# Patient Record
Sex: Female | Born: 2000 | Race: White | Hispanic: No | Marital: Single | Smoking: Never smoker
Health system: Southern US, Community
[De-identification: ages and names within clinical notes are randomized; demographics above are authoritative.]

## PROBLEM LIST (undated history)

## (undated) DIAGNOSIS — L0591 Pilonidal cyst without abscess: Secondary | ICD-10-CM

---
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
## 2019-01-01 ENCOUNTER — Encounter (HOSPITAL_COMMUNITY)
Admission: RE | Admit: 2019-01-01 | Discharge: 2019-01-01 | Disposition: A | Discharge status: Home / Self Care | Payer: BLUE CROSS/BLUE SHIELD | Source: Ambulatory Visit | Attending: Hematology and Oncology | Admitting: Hematology and Oncology

## 2019-01-01 DIAGNOSIS — R911 Solitary pulmonary nodule: Secondary | ICD-10-CM | POA: Insufficient documentation

## 2019-01-01 LAB — GLUCOSE, CAPILLARY: GLUCOSE-CAPILLARY: 83 mg/dL (ref 70–99)

## 2019-01-01 MED ORDER — FLUDEOXYGLUCOSE F - 18 (FDG) INJECTION
7.4200 | Freq: Once | INTRAVENOUS | Status: AC | PRN
  Administered 2019-01-01: 7.42 via INTRAVENOUS

## 2019-01-02 NOTE — Progress Notes (Signed)
Patient Care Team: Porfirio Oar, PA as PCP - General (Family Medicine)  DIAGNOSIS:    ICD-10-CM   1. Solitary pulmonary nodule R91.1 CT Chest W Contrast    Lupus anticoagulant panel    CMP (Cancer Center only)    CBC with Differential (Cancer Center Only)  2. 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 A PET scan from 01/02/19 showed regression of the left lung lesion with low-level hypermetabolism favoring infection versus less likely infarct. She presents to the clinic today with her mom and grandparents. She denies chest pain, SOB, fever, or cough. She reviewed her medication list with me.   REVIEW OF SYSTEMS:   Constitutional: Denies fevers, chills or abnormal weight loss Eyes: Denies blurriness of vision Ears, nose, mouth, throat, and face: Denies mucositis or sore throat Respiratory: Denies cough, dyspnea or wheezes Cardiovascular: Denies palpitation, chest discomfort Gastrointestinal: Denies nausea, heartburn or change in bowel habits Skin: Denies abnormal skin rashes Lymphatics: Denies new lymphadenopathy or easy bruising Neurological: Denies numbness, tingling or new weaknesses Behavioral/Psych: Mood is stable, no new changes  Extremities: No lower extremity edema All other systems were reviewed with the patient and are negative.  I have reviewed the past medical history, past surgical history, social history and family history with the patient and they are unchanged from previous note.  ALLERGIES:  has No Known Allergies.  MEDICATIONS:  Current Outpatient Medications  Medication Sig Dispense Refill  . ferrous sulfate 325 (65 FE) MG tablet Take 1 tablet (325 mg total) by mouth daily with breakfast. 60 tablet 0  . Multiple Vitamins-Iron (MULTIVITAMINS WITH IRON) TABS tablet Take  1 tablet by mouth daily. 60 tablet 0  . rivaroxaban (XARELTO) 20 MG TABS tablet Take 1 tablet (20 mg total) by mouth daily with supper. 30 tablet 6   No current facility-administered medications for this visit.     PHYSICAL EXAMINATION: ECOG PERFORMANCE STATUS: 0 - Asymptomatic  Vitals:   01/03/19 1106  BP: 120/71  Pulse: 86  Resp: 20  Temp: 98.8 F (37.1 C)  SpO2: 100%   Filed Weights   01/03/19 1106  Weight: 137 lb 8 oz (62.4 kg)    GENERAL: alert, no distress and comfortable SKIN: skin color, texture, turgor are normal, no rashes or significant lesions EYES: normal, Conjunctiva are pink and non-injected, sclera clear OROPHARYNX: no exudate, no erythema and lips, buccal mucosa, and tongue normal  NECK: supple, thyroid normal size, non-tender, without nodularity LYMPH: no palpable lymphadenopathy in the cervical, axillary or inguinal LUNGS: clear to auscultation and percussion with normal breathing effort HEART: regular rate & rhythm and no murmurs and no lower extremity edema ABDOMEN: abdomen soft, non-tender and normal bowel sounds MUSCULOSKELETAL: no cyanosis of digits and no clubbing  NEURO: alert & oriented x 3 with fluent speech, no focal motor/sensory deficits EXTREMITIES: No lower extremity edema  LABORATORY DATA:  I have reviewed the data as listed CMP Latest Ref Rng & Units 12/07/2018  Glucose 70 - 99 mg/dL 99  BUN 6 - 20 mg/dL 11  Creatinine 0.16 - 0.10 mg/dL 9.32  Sodium 355 - 732 mmol/L 138  Potassium 3.5 - 5.1 mmol/L 3.6  Chloride 98 - 111 mmol/L 105  CO2 22 - 32 mmol/L 26  Calcium 8.9 - 10.3 mg/dL 9.0  Total Protein 6.5 - 8.1 g/dL 1.8(E)  Total Bilirubin 0.3 - 1.2 mg/dL 9.9(B)  Alkaline Phos 38 - 126 U/L 129(H)  AST 15 - 41 U/L 19  ALT 0 - 44 U/L 25    Lab Results  Component Value Date   WBC 11.1 (H) 12/07/2018   HGB 9.6 (L) 12/07/2018   HCT 32.9 (L) 12/07/2018   MCV 71.2 (L) 12/07/2018   PLT 596 (H) 12/07/2018   NEUTROABS 7.0 12/07/2018     ASSESSMENT & PLAN:  Pulmonary infarct (HCC) 12/07/2018: ED visit for pain on inspiration for 2 years: 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  Inherited risk factors: 1. Factor V Leiden mutation: Normal 2. Prothrombin gene G20210A: Normal 3. Protein S deficiency: Normal 4. Protein C deficiency: Normal 5. Antithrombin deficiency: Normal  Acquired risk factors include: 1. 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 Left lower lobe lung mass: PET/CT does not show any activity and in fact it is smaller than before.  It was 5.8 cm and it is now 4 cm with SUV of 2.9.  Based on the improvement I suspect this is a benign etiology.  I suspect that this is related to pulmonary infarction. We will plan to obtain a CT and follow-up in 3 months.    Orders Placed This Encounter  Procedures  . CT Chest W Contrast    Standing Status:   Future    Standing Expiration Date:   01/03/2020    Order Specific Question:   ** REASON FOR EXAM (FREE TEXT)    Answer:   Left lung mass    Order Specific Question:   If indicated for the ordered procedure, I authorize the administration of contrast media per Radiology protocol    Answer:   Yes    Order Specific Question:   Is patient pregnant?    Answer:   No    Order Specific Question:   Preferred imaging location?    Answer:   [facility]    Order Specific Question:   Radiology Contrast Protocol - do NOT remove file path    Answer:   [email or web address]  . Lupus anticoagulant panel    Standing Status:   Future    Standing Expiration Date:   02/07/2020  . CMP (Cancer Center only)    Standing Status:    Future    Standing Expiration Date:   01/03/2020  . CBC with Differential (Cancer Center Only)    Standing Status:   Future    Standing Expiration Date:   01/03/2020   The patient has a good understanding of the overall plan. she agrees with it. she will call with any problems that may develop before the next visit here.  Serena Croissant, MD 01/03/2019  Lindsey Stewart am acting as scribe for Dr. Serena Croissant.  I have reviewed the above documentation for accuracy and completeness, and I agree with the above.

## 2019-01-03 ENCOUNTER — Other Ambulatory Visit: Payer: Self-pay

## 2019-01-03 ENCOUNTER — Inpatient Hospital Stay: Payer: BLUE CROSS/BLUE SHIELD | Attending: Hematology and Oncology | Admitting: Hematology and Oncology

## 2019-01-03 ENCOUNTER — Telehealth: Payer: Self-pay | Admitting: Hematology and Oncology

## 2019-01-03 DIAGNOSIS — I2699 Other pulmonary embolism without acute cor pulmonale: Secondary | ICD-10-CM | POA: Insufficient documentation

## 2019-01-03 DIAGNOSIS — R911 Solitary pulmonary nodule: Secondary | ICD-10-CM | POA: Diagnosis not present

## 2019-01-03 DIAGNOSIS — Z7901 Long term (current) use of anticoagulants: Secondary | ICD-10-CM | POA: Insufficient documentation

## 2019-01-03 DIAGNOSIS — R918 Other nonspecific abnormal finding of lung field: Secondary | ICD-10-CM | POA: Diagnosis not present

## 2019-01-03 MED ORDER — RIVAROXABAN 20 MG PO TABS: 20.0000 mg | ORAL_TABLET | Freq: Every day | ORAL | 6 refills | Status: DC

## 2019-01-03 NOTE — Telephone Encounter (Signed)
Gave avs and calendar ° °

## 2019-01-03 NOTE — Assessment & Plan Note (Signed)
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  Inherited risk factors: 1. Factor V Leiden mutation: Normal 2. Prothrombin gene G20210A: Normal 3. Protein S deficiency: Normal 4. Protein C deficiency: Normal 5. Antithrombin deficiency: Normal  Acquired risk factors include: 1. 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  It was 5.8 cm and it is now 4 cm with SUV of 2.9.  Based on the improvement I suspect this is a benign etiology.  I suspect that this is related to pulmonary infarction. We will plan to obtain a CT and follow-up in 3 months.

## 2019-01-08 ENCOUNTER — Ambulatory Visit: Payer: BLUE CROSS/BLUE SHIELD | Admitting: Hematology and Oncology

## 2019-02-13 ENCOUNTER — Other Ambulatory Visit: Payer: Self-pay | Admitting: *Deleted

## 2019-02-13 DIAGNOSIS — I2699 Other pulmonary embolism without acute cor pulmonale: Secondary | ICD-10-CM

## 2019-04-05 ENCOUNTER — Other Ambulatory Visit: Payer: Self-pay

## 2019-04-05 ENCOUNTER — Telehealth: Payer: Self-pay | Admitting: Hematology and Oncology

## 2019-04-05 ENCOUNTER — Inpatient Hospital Stay: Payer: BC Managed Care – PPO | Attending: Hematology and Oncology

## 2019-04-05 ENCOUNTER — Ambulatory Visit (HOSPITAL_COMMUNITY)
Admission: RE | Admit: 2019-04-05 | Discharge: 2019-04-05 | Disposition: A | Discharge status: Home / Self Care | Payer: BC Managed Care – PPO | Source: Ambulatory Visit | Attending: Hematology and Oncology | Admitting: Hematology and Oncology

## 2019-04-05 DIAGNOSIS — D509 Iron deficiency anemia, unspecified: Secondary | ICD-10-CM | POA: Diagnosis not present

## 2019-04-05 DIAGNOSIS — R911 Solitary pulmonary nodule: Secondary | ICD-10-CM | POA: Diagnosis not present

## 2019-04-05 DIAGNOSIS — R918 Other nonspecific abnormal finding of lung field: Secondary | ICD-10-CM | POA: Insufficient documentation

## 2019-04-05 DIAGNOSIS — Z7901 Long term (current) use of anticoagulants: Secondary | ICD-10-CM | POA: Diagnosis not present

## 2019-04-05 DIAGNOSIS — D6861 Antiphospholipid syndrome: Secondary | ICD-10-CM | POA: Insufficient documentation

## 2019-04-05 DIAGNOSIS — I2699 Other pulmonary embolism without acute cor pulmonale: Secondary | ICD-10-CM | POA: Diagnosis not present

## 2019-04-05 LAB — CMP (CANCER CENTER ONLY)
ALT: 7 U/L (ref 0–44)
AST: 9 U/L — ABNORMAL LOW (ref 15–41)
Albumin: 3.5 g/dL (ref 3.5–5.0)
Alkaline Phosphatase: 95 U/L (ref 38–126)
Anion gap: 10 (ref 5–15)
BUN: 7 mg/dL (ref 6–20)
CO2: 24 mmol/L (ref 22–32)
Calcium: 8.7 mg/dL — ABNORMAL LOW (ref 8.9–10.3)
Chloride: 105 mmol/L (ref 98–111)
Creatinine: 0.74 mg/dL (ref 0.44–1.00)
GFR, Est AFR Am: >60 mL/min (ref >60)
GFR, Estimated: >60 mL/min (ref >60)
Glucose, Bld: 88 mg/dL (ref 70–99)
Potassium: 3.5 mmol/L (ref 3.5–5.1)
Sodium: 139 mmol/L (ref 135–145)
Total Bilirubin: 0.3 mg/dL (ref 0.3–1.2)
Total Protein: 7.5 g/dL (ref 6.5–8.1)

## 2019-04-05 LAB — CBC WITH DIFFERENTIAL (CANCER CENTER ONLY)
Abs Immature Granulocytes: 0.07 10*3/uL (ref 0.00–0.07)
Basophils Absolute: 0.1 10*3/uL (ref 0.0–0.1)
Basophils Relative: 1 %
Eosinophils Absolute: 0.3 10*3/uL (ref 0.0–0.5)
Eosinophils Relative: 3 %
HCT: 30.9 % — ABNORMAL LOW (ref 36.0–46.0)
Hemoglobin: 9.1 g/dL — ABNORMAL LOW (ref 12.0–15.0)
Immature Granulocytes: 1 %
Lymphocytes Relative: 24 %
Lymphs Abs: 2.6 10*3/uL (ref 0.7–4.0)
MCH: 20.6 pg — ABNORMAL LOW (ref 26.0–34.0)
MCHC: 29.4 g/dL — ABNORMAL LOW (ref 30.0–36.0)
MCV: 70.1 fL — ABNORMAL LOW (ref 80.0–100.0)
Monocytes Absolute: 0.6 10*3/uL (ref 0.1–1.0)
Monocytes Relative: 6 %
Neutro Abs: 7.3 10*3/uL (ref 1.7–7.7)
Neutrophils Relative %: 65 %
Platelet Count: 481 10*3/uL — ABNORMAL HIGH (ref 150–400)
RBC: 4.41 MIL/uL (ref 3.87–5.11)
RDW: 15.4 % (ref 11.5–15.5)
WBC Count: 10.9 10*3/uL — ABNORMAL HIGH (ref 4.0–10.5)
nRBC: 0 % (ref 0.0–0.2)

## 2019-04-05 LAB — FERRITIN: Ferritin: 15 ng/mL (ref 11–307)

## 2019-04-05 LAB — IRON AND TIBC
Iron: 14 ug/dL — ABNORMAL LOW (ref 41–142)
Saturation Ratios: 5 % — ABNORMAL LOW (ref 21–57)
TIBC: 306 ug/dL (ref 236–444)
UIBC: 292 ug/dL (ref 120–384)

## 2019-04-05 MED ORDER — SODIUM CHLORIDE (PF) 0.9 % IJ SOLN
INTRAMUSCULAR | Status: AC
  Filled 2019-04-05: qty 50

## 2019-04-05 MED ORDER — IOHEXOL 300 MG/ML  SOLN
75.0000 mL | Freq: Once | INTRAMUSCULAR | Status: AC | PRN
  Administered 2019-04-05: 75 mL via INTRAVENOUS

## 2019-04-05 NOTE — Assessment & Plan Note (Signed)
Pulmonary infarct [facility]) 12/07/2018: ED visit for pain on inspiration for 2 years: Subpleural left lower lobe PE(suspected)although the differential diagnosis also included pulmonary infarct versus pneumonia versus neoplasm (low likelihood) Current treatment: Xarelto started February 2020  I discussed with the patient risk factors for blood clots.  Inherited risk factors: 1. Factor V Leiden mutation: Normal 2. Prothrombin gene G20210A: Normal 3. Protein S deficiency: Normal 4. Protein C deficiency: Normal 5. Antithrombin deficiency: Normal  Acquired risk factorsinclude: 1. 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  It was 5.8 cm and it is now 4 cm with SUV of 2.9. CT chest 04/05/2019:  Based on the improvement I suspect this is a benign etiology.  I suspect that this is related to pulmonary infarction. There is no need for repeat scans unless she has new symptoms.  Patient will remain on anticoagulation for

## 2019-04-05 NOTE — Telephone Encounter (Signed)
F/U 6/19 converted to doximity. Confirmed with patient.

## 2019-04-07 LAB — LUPUS ANTICOAGULANT PANEL
DRVVT: 79.3 s — ABNORMAL HIGH (ref 0.0–47.0)
PTT Lupus Anticoagulant: 44.9 s (ref 0.0–51.9)

## 2019-04-07 LAB — DRVVT MIX: dRVVT Mix: 60 s — ABNORMAL HIGH (ref 0.0–47.0)

## 2019-04-07 LAB — DRVVT CONFIRM: dRVVT Confirm: 1.3 ratio — ABNORMAL HIGH (ref 0.8–1.2)

## 2019-04-11 ENCOUNTER — Telehealth: Payer: Self-pay | Admitting: Hematology and Oncology

## 2019-04-11 NOTE — Progress Notes (Signed)
HEMATOLOGY-ONCOLOGY DOXIMITY VISIT PROGRESS NOTE  I connected with Lindsey Stewart on 04/12/2019 at  8:15 AM EDT by Doximity video conference and verified that I am speaking with the correct person using two identifiers.  I discussed the limitations, risks, security and privacy concerns of performing an evaluation and management service by Doximity and the availability of in person appointments.  I also discussed with the patient that there may be a patient responsible charge related to this service. The patient expressed understanding and agreed to proceed.  Patient's Location: Home Physician Location: Clinic  CHIEF COMPLIANT: Follow-up of PE and left lung infarct  INTERVAL HISTORY: Lindsey Stewart is a 18 y.o. female with above-mentioned history of lung infarct and PE, for which she started Xarelto in 11/2018. Chest CT on 04/05/19 showed near complete resolution of the left-lower lobe plueral-based opacity consistent with resolving infarct and no evidence of lymphadenopathy or pleural effusion. Labs on 04/05/19 showed: WBC 10.9, Hg 9.1, HCT 30.9, MCV 70.1, platelets 481,000, iron saturation 5%, ferritin 15, lupus anticoagulant positive. She presents over Doximity today to review her labs and CT scan.   REVIEW OF SYSTEMS:   Constitutional: Denies fevers, chills or abnormal weight loss Eyes: Denies blurriness of vision Ears, nose, mouth, throat, and face: Denies mucositis or sore throat Respiratory: Denies cough, dyspnea or wheezes Cardiovascular: Denies palpitation, chest discomfort Gastrointestinal:  Denies nausea, heartburn or change in bowel habits Skin: Denies abnormal skin rashes Lymphatics: Denies new lymphadenopathy or easy bruising Neurological:Denies numbness, tingling or new weaknesses Behavioral/Psych: Mood is stable, no new changes  Extremities: No lower extremity edema Breast: denies any pain or lumps or nodules in either breasts All other systems were reviewed with the  patient and are negative.  Observations/Objective:  There were no vitals filed for this visit. There is no height or weight on file to calculate BMI.  I have reviewed the data as listed CMP Latest Ref Rng & Units 04/05/2019 12/07/2018  Glucose 70 - 99 mg/dL 88 99  BUN 6 - 20 mg/dL 7 11  Creatinine 1.610.44 - 1.00 mg/dL 0.960.74 0.450.68  Sodium 409135 - 145 mmol/L 139 138  Potassium 3.5 - 5.1 mmol/L 3.5 3.6  Chloride 98 - 111 mmol/L 105 105  CO2 22 - 32 mmol/L 24 26  Calcium 8.9 - 10.3 mg/dL 8.1(X8.7(L) 9.0  Total Protein 6.5 - 8.1 g/dL 7.5 9.1(Y8.5(H)  Total Bilirubin 0.3 - 1.2 mg/dL 0.3 7.8(G0.1(L)  Alkaline Phos 38 - 126 U/L 95 129(H)  AST 15 - 41 U/L 9(L) 19  ALT 0 - 44 U/L 7 25    Lab Results  Component Value Date   WBC 10.9 (H) 04/05/2019   HGB 9.1 (L) 04/05/2019   HCT 30.9 (L) 04/05/2019   MCV 70.1 (L) 04/05/2019   PLT 481 (H) 04/05/2019   NEUTROABS 7.3 04/05/2019      Assessment Plan:  Pulmonary emboli [facility](HCC) Pulmonary infarct (HCC) 12/07/2018: ED visit for pain on inspiration for 2 years: Subpleural left lower lobe PE(suspected)although the differential diagnosis also included pulmonary infarct versus pneumonia versus neoplasm (low likelihood) Current treatment: Xarelto started February 2020  I discussed with the patient risk factors for blood clots.  Inherited risk factors: 1. Factor V Leiden mutation: Normal 2. Prothrombin gene G20210A: Normal 3. Protein S deficiency: Normal 4. Protein C deficiency: Normal 5. Antithrombin deficiency: Normal  Acquired risk factorsinclude: 1. 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  Left lower lobe lung mass: PET/CT does not show any activity and in fact it is smaller than before.  It was 5.8  cm and it is now 4 cm with SUV of 2.9. CT chest 04/05/2019: Near complete resolution of the left lower pleural-based opacity consistent with an infarct  Based on the improvement I suspect this is a benign etiology.  I suspect that this is related to pulmonary infarction. There is no need for repeat scans unless she has new symptoms.  Patient will remain on anticoagulation for life   I discussed the assessment and treatment plan with the patient. The patient was provided an opportunity to ask questions and all were answered. The patient agreed with the plan and demonstrated an understanding of the instructions. The patient was advised to call back or seek an in-person evaluation if the symptoms worsen or if the condition fails to improve as anticipated.   I provided 25 minutes of face-to-face Doximity time during this encounter.    Rulon Eisenmenger, MD 04/12/2019   I, Molly Dorshimer, am acting as scribe for Nicholas Lose, MD.  I have reviewed the above documentation for accuracy and completeness, and I agree with the above.

## 2019-04-11 NOTE — Telephone Encounter (Signed)
Left voicemail to confirm appt and verify info. °

## 2019-04-12 ENCOUNTER — Inpatient Hospital Stay (HOSPITAL_BASED_OUTPATIENT_CLINIC_OR_DEPARTMENT_OTHER): Payer: BC Managed Care – PPO | Admitting: Hematology and Oncology

## 2019-04-12 ENCOUNTER — Other Ambulatory Visit: Payer: Self-pay

## 2019-04-12 ENCOUNTER — Telehealth: Payer: Self-pay

## 2019-04-12 DIAGNOSIS — D500 Iron deficiency anemia secondary to blood loss (chronic): Secondary | ICD-10-CM | POA: Insufficient documentation

## 2019-04-12 DIAGNOSIS — I2699 Other pulmonary embolism without acute cor pulmonale: Secondary | ICD-10-CM

## 2019-04-12 DIAGNOSIS — D508 Other iron deficiency anemias: Secondary | ICD-10-CM | POA: Diagnosis not present

## 2019-04-12 DIAGNOSIS — R918 Other nonspecific abnormal finding of lung field: Secondary | ICD-10-CM

## 2019-04-12 NOTE — Telephone Encounter (Signed)
Lab called to say gliadin antibodies and endomysial ab scrn oders had been released but pt did not have lab appt today. Per Dr Lindi Adie these labs need to be drawn when pt comes back in 1 wk for iron infusion. Orders have been re-entered.

## 2019-04-15 ENCOUNTER — Telehealth: Payer: Self-pay | Admitting: Hematology and Oncology

## 2019-04-15 NOTE — Telephone Encounter (Signed)
I left a message regarding schedule  

## 2019-04-19 ENCOUNTER — Inpatient Hospital Stay: Payer: BC Managed Care – PPO

## 2019-04-19 ENCOUNTER — Other Ambulatory Visit: Payer: Self-pay

## 2019-04-19 ENCOUNTER — Telehealth: Payer: Self-pay | Admitting: Hematology and Oncology

## 2019-04-19 VITALS — BP 111/70 | HR 79 | Temp 98.2°F | Resp 18

## 2019-04-19 DIAGNOSIS — I2699 Other pulmonary embolism without acute cor pulmonale: Secondary | ICD-10-CM | POA: Diagnosis not present

## 2019-04-19 DIAGNOSIS — D500 Iron deficiency anemia secondary to blood loss (chronic): Secondary | ICD-10-CM

## 2019-04-19 MED ORDER — SODIUM CHLORIDE 0.9 % IV SOLN
Freq: Once | INTRAVENOUS | Status: AC
  Administered 2019-04-19: 09:00:00 via INTRAVENOUS
  Filled 2019-04-19: qty 250

## 2019-04-19 MED ORDER — SODIUM CHLORIDE 0.9 % IV SOLN
510.0000 mg | Freq: Once | INTRAVENOUS | Status: AC
  Administered 2019-04-19: 510 mg via INTRAVENOUS
  Filled 2019-04-19: qty 17

## 2019-04-19 NOTE — Telephone Encounter (Signed)
Scheduled appt per 6/26 sch message- pt aware of appt date and time and will get an updated schedule on 7/6

## 2019-04-19 NOTE — Patient Instructions (Signed)

## 2019-04-22 LAB — GLIADIN ANTIBODIES, SERUM
Antigliadin Abs, IgA: 13 units (ref 0–19)
Gliadin IgG: 10 units (ref 0–19)

## 2019-04-23 ENCOUNTER — Telehealth: Payer: Self-pay | Admitting: Hematology and Oncology

## 2019-04-23 LAB — ENDOMYSIAL IGA ANTIBODY: Endomysial Ab, IgA: NEGATIVE

## 2019-04-23 NOTE — Telephone Encounter (Signed)
I informed the patient that the celiac antibodies are negative.

## 2019-04-29 ENCOUNTER — Other Ambulatory Visit: Payer: Self-pay

## 2019-04-29 ENCOUNTER — Inpatient Hospital Stay: Payer: BC Managed Care – PPO | Attending: Hematology and Oncology

## 2019-04-29 VITALS — BP 108/77 | HR 75 | Temp 98.6°F | Resp 16

## 2019-04-29 DIAGNOSIS — D500 Iron deficiency anemia secondary to blood loss (chronic): Secondary | ICD-10-CM | POA: Diagnosis not present

## 2019-04-29 MED ORDER — SODIUM CHLORIDE 0.9 % IV SOLN
Freq: Once | INTRAVENOUS | Status: AC
  Administered 2019-04-29: 09:00:00 via INTRAVENOUS
  Filled 2019-04-29: qty 250

## 2019-04-29 MED ORDER — SODIUM CHLORIDE 0.9 % IV SOLN
510.0000 mg | Freq: Once | INTRAVENOUS | Status: AC
  Administered 2019-04-29: 09:00:00 510 mg via INTRAVENOUS
  Filled 2019-04-29: qty 17

## 2019-04-29 NOTE — Patient Instructions (Signed)
Ferumoxytol injection What is this medicine? FERUMOXYTOL is an iron complex. Iron is used to make healthy red blood cells, which carry oxygen and nutrients throughout the body. This medicine is used to treat iron deficiency anemia. This medicine may be used for other purposes; ask your health care provider or pharmacist if you have questions. COMMON BRAND NAME(S): Feraheme What should I tell my health care provider before I take this medicine? They need to know if you have any of these conditions:  anemia not caused by low iron levels  high levels of iron in the blood  magnetic resonance imaging (MRI) test scheduled  an unusual or allergic reaction to iron, other medicines, foods, dyes, or preservatives  pregnant or trying to get pregnant  breast-feeding How should I use this medicine? This medicine is for injection into a vein. It is given by a health care professional in a hospital or clinic setting. Talk to your pediatrician regarding the use of this medicine in children. Special care may be needed. Overdosage: If you think you have taken too much of this medicine contact a poison control center or emergency room at once. NOTE: This medicine is only for you. Do not share this medicine with others. What if I miss a dose? It is important not to miss your dose. Call your doctor or health care professional if you are unable to keep an appointment. What may interact with this medicine? This medicine may interact with the following medications:  other iron products This list may not describe all possible interactions. Give your health care provider a list of all the medicines, herbs, non-prescription drugs, or dietary supplements you use. Also tell them if you smoke, drink alcohol, or use illegal drugs. Some items may interact with your medicine. What should I watch for while using this medicine? Visit your doctor or healthcare professional regularly. Tell your doctor or healthcare  professional if your symptoms do not start to get better or if they get worse. You may need blood work done while you are taking this medicine. You may need to follow a special diet. Talk to your doctor. Foods that contain iron include: whole grains/cereals, dried fruits, beans, or peas, leafy green vegetables, and organ meats (liver, kidney). What side effects may I notice from receiving this medicine? Side effects that you should report to your doctor or health care professional as soon as possible:  allergic reactions like skin rash, itching or hives, swelling of the face, lips, or tongue  breathing problems  changes in blood pressure  feeling faint or lightheaded, falls  fever or chills  flushing, sweating, or hot feelings  swelling of the ankles or feet Side effects that usually do not require medical attention (report to your doctor or health care professional if they continue or are bothersome):  diarrhea  headache  nausea, vomiting  stomach pain This list may not describe all possible side effects. Call your doctor for medical advice about side effects. You may report side effects to FDA at 1-800-FDA-1088. Where should I keep my medicine? This drug is given in a hospital or clinic and will not be stored at home. NOTE: This sheet is a summary. It may not cover all possible information. If you have questions about this medicine, talk to your doctor, pharmacist, or health care provider.  2020 Elsevier/Gold Standard (2016-11-28 20:21:10) Coronavirus (COVID-19) Are you at risk?  Are you at risk for the Coronavirus (COVID-19)?  To be considered HIGH RISK for Coronavirus (COVID-19),   you have to meet the following criteria:  . Traveled to [place], [place], [place], [place] or [place]; or in the [place] to [place], [place], [place], or [place]; and have fever, cough, and shortness of breath within the last 2 weeks of travel OR . Been in close contact with a person  diagnosed with COVID-19 within the last 2 weeks and have fever, cough, and shortness of breath . IF YOU DO NOT MEET THESE CRITERIA, YOU ARE CONSIDERED LOW RISK FOR COVID-19.  What to do if you are HIGH RISK for COVID-19?  . If you are having a medical emergency, call 911. . Seek medical care right away. Before you go to a doctor's office, urgent care or emergency department, call ahead and tell them about your recent travel, contact with someone diagnosed with COVID-19, and your symptoms. You should receive instructions from your physician's office regarding next steps of care.  . When you arrive at healthcare provider, tell the healthcare staff immediately you have returned from visiting [place], [place], [place], [place] or [place]; or traveled in the [place] to [place], [place], [place], or [place]; in the last two weeks or you have been in close contact with a person diagnosed with COVID-19 in the last 2 weeks.   . Tell the health care staff about your symptoms: fever, cough and shortness of breath. . After you have been seen by a medical provider, you will be either: o Tested for (COVID-19) and discharged home on quarantine except to seek medical care if symptoms worsen, and asked to  - Stay home and avoid contact with others until you get your results (4-5 days)  - Avoid travel on public transportation if possible (such as bus, train, or airplane) or o Sent to the Emergency Department by EMS for evaluation, COVID-19 testing, and possible admission depending on your condition and test results.  What to do if you are LOW RISK for COVID-19?  Reduce your risk of any infection by using the same precautions used for avoiding the common cold or flu:  . Wash your hands often with soap and warm water for at least 20 seconds.  If soap and water are not readily available, use an alcohol-based hand sanitizer with at least 60% alcohol.  . If coughing or sneezing, cover your mouth and nose by  coughing or sneezing into the elbow areas of your shirt or coat, into a tissue or into your sleeve (not your hands). . Avoid shaking hands with others and consider head nods or verbal greetings only. . Avoid touching your eyes, nose, or mouth with unwashed hands.  . Avoid close contact with people who are sick. . Avoid places or events with large numbers of people in one location, like concerts or sporting events. . Carefully consider travel plans you have or are making. . If you are planning any travel outside or inside the [place], visit the CDC's Travelers' Health webpage for the latest health notices. . If you have some symptoms but not all symptoms, continue to monitor at home and seek medical attention if your symptoms worsen. . If you are having a medical emergency, call 911.   ADDITIONAL HEALTHCARE OPTIONS FOR PATIENTS  [facility] / e-Visit: [email or web address]         [facility]: [phone]  [facility]: [phone]                   [facility]: [phone]   

## 2019-06-28 ENCOUNTER — Telehealth: Payer: Self-pay | Admitting: Hematology and Oncology

## 2019-06-28 NOTE — Telephone Encounter (Signed)
Patient returned phone call regarding voicemail that was left, per patient's request due to scheduling conflicts would like appointments moved to 06/21 and 06/23.  Message to provider.

## 2019-06-28 NOTE — Telephone Encounter (Signed)
Returned patient's phone call regarding September appointments, left a voicemail.

## 2019-07-10 ENCOUNTER — Other Ambulatory Visit: Payer: BC Managed Care – PPO

## 2019-07-12 ENCOUNTER — Ambulatory Visit: Payer: BC Managed Care – PPO | Admitting: Hematology and Oncology

## 2019-07-15 ENCOUNTER — Other Ambulatory Visit: Payer: Self-pay

## 2019-07-15 ENCOUNTER — Inpatient Hospital Stay: Payer: BC Managed Care – PPO | Attending: Hematology and Oncology

## 2019-07-15 DIAGNOSIS — R918 Other nonspecific abnormal finding of lung field: Secondary | ICD-10-CM | POA: Insufficient documentation

## 2019-07-15 DIAGNOSIS — I2699 Other pulmonary embolism without acute cor pulmonale: Secondary | ICD-10-CM | POA: Insufficient documentation

## 2019-07-15 DIAGNOSIS — Z7901 Long term (current) use of anticoagulants: Secondary | ICD-10-CM | POA: Diagnosis not present

## 2019-07-15 DIAGNOSIS — D509 Iron deficiency anemia, unspecified: Secondary | ICD-10-CM | POA: Insufficient documentation

## 2019-07-15 DIAGNOSIS — D500 Iron deficiency anemia secondary to blood loss (chronic): Secondary | ICD-10-CM

## 2019-07-15 LAB — CBC WITH DIFFERENTIAL (CANCER CENTER ONLY)
Abs Immature Granulocytes: 0.04 10*3/uL (ref 0.00–0.07)
Basophils Absolute: 0.1 10*3/uL (ref 0.0–0.1)
Basophils Relative: 1 %
Eosinophils Absolute: 0.4 10*3/uL (ref 0.0–0.5)
Eosinophils Relative: 4 %
HCT: 39.4 % (ref 36.0–46.0)
Hemoglobin: 12.6 g/dL (ref 12.0–15.0)
Immature Granulocytes: 0 %
Lymphocytes Relative: 29 %
Lymphs Abs: 3 10*3/uL (ref 0.7–4.0)
MCH: 26.5 pg (ref 26.0–34.0)
MCHC: 32 g/dL (ref 30.0–36.0)
MCV: 82.8 fL (ref 80.0–100.0)
Monocytes Absolute: 0.6 10*3/uL (ref 0.1–1.0)
Monocytes Relative: 6 %
Neutro Abs: 6.3 10*3/uL (ref 1.7–7.7)
Neutrophils Relative %: 60 %
Platelet Count: 388 10*3/uL (ref 150–400)
RBC: 4.76 MIL/uL (ref 3.87–5.11)
RDW: 17.6 % — ABNORMAL HIGH (ref 11.5–15.5)
WBC Count: 10.4 10*3/uL (ref 4.0–10.5)
nRBC: 0 % (ref 0.0–0.2)

## 2019-07-15 LAB — IRON AND TIBC
Iron: 53 ug/dL (ref 41–142)
Saturation Ratios: 22 % (ref 21–57)
TIBC: 245 ug/dL (ref 236–444)
UIBC: 192 ug/dL (ref 120–384)

## 2019-07-15 LAB — FERRITIN: Ferritin: 124 ng/mL (ref 11–307)

## 2019-07-16 NOTE — Progress Notes (Signed)
HEMATOLOGY-ONCOLOGY DOXIMITY VISIT PROGRESS NOTE  I connected with Lindsey Stewart on 07/17/2019 at  2:45 PM EDT by Doximity video conference and verified that I am speaking with the correct person using two identifiers.  I discussed the limitations, risks, security and privacy concerns of performing an evaluation and management service by Doximity and the availability of in person appointments.  I also discussed with the patient that there may be a patient responsible charge related to this service. The patient expressed understanding and agreed to proceed.  Patient's Location: Home Physician Location: Clinic  CHIEF COMPLIANT: Follow-up of PE and iron deficiency anemia  INTERVAL HISTORY: Lindsey Stewart is a 18 y.o. female with above-mentioned history of iron deficiency anemia, for which she has received IV iron, and PE, for which she is on Xarelto. Labs on 07/15/19 showed: Hg 12.6, HCT 39.4, MCV 82.8, platelets 388,000, iron saturation 22%, ferritin 124. She presents over Doximity today for follow-up to review her labs.   REVIEW OF SYSTEMS:   Constitutional: Denies fevers, chills or abnormal weight loss Eyes: Denies blurriness of vision Ears, nose, mouth, throat, and face: Denies mucositis or sore throat Respiratory: Denies cough, dyspnea or wheezes Cardiovascular: Denies palpitation, chest discomfort Gastrointestinal:  Denies nausea, heartburn or change in bowel habits Skin: Denies abnormal skin rashes Lymphatics: Denies new lymphadenopathy or easy bruising Neurological:Denies numbness, tingling or new weaknesses Behavioral/Psych: Mood is stable, no new changes  Extremities: No lower extremity edema Breast: denies any pain or lumps or nodules in either breasts All other systems were reviewed with the patient and are negative.  Observations/Objective:  There were no vitals filed for this visit. There is no height or weight on file to calculate BMI.  I have reviewed the data as  listed CMP Latest Ref Rng & Units 04/05/2019 12/07/2018  Glucose 70 - 99 mg/dL 88 99  BUN 6 - 20 mg/dL 7 11  Creatinine 0.44 - 1.00 mg/dL 0.74 0.68  Sodium 135 - 145 mmol/L 139 138  Potassium 3.5 - 5.1 mmol/L 3.5 3.6  Chloride 98 - 111 mmol/L 105 105  CO2 22 - 32 mmol/L 24 26  Calcium 8.9 - 10.3 mg/dL 8.7(L) 9.0  Total Protein 6.5 - 8.1 g/dL 7.5 8.5(H)  Total Bilirubin 0.3 - 1.2 mg/dL 0.3 0.1(L)  Alkaline Phos 38 - 126 U/L 95 129(H)  AST 15 - 41 U/L 9(L) 19  ALT 0 - 44 U/L 7 25    Lab Results  Component Value Date   WBC 10.4 07/15/2019   HGB 12.6 07/15/2019   HCT 39.4 07/15/2019   MCV 82.8 07/15/2019   PLT 388 07/15/2019   NEUTROABS 6.3 07/15/2019      Assessment Plan:  Pulmonary emboli ([place]) 12/07/2018: ED visit for pain on inspiration for 2 years: Subpleural left lower lobe PE(suspected)although the differential diagnosis also included pulmonary infarct versus pneumonia versus neoplasm (low likelihood) Current treatment: Xarelto started February 2020  I discussed with the patient risk factors for blood clots.  Inherited risk factors: 1. Factor V Leiden mutation: Normal 2. Prothrombin gene G20210A: Normal 3. Protein S deficiency: Normal 4. Protein C deficiency: Normal 5. Antithrombin deficiency: Normal  Acquired risk factorsinclude: 1. 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 It was 5.8 cm and it is now 4 cm with SUV of 2.9. CT chest 04/05/2019: Near complete  resolution of the left lower pleural-based opacity consistent with an infarct We do not plan on repeating CT scans unless she has new symptoms.  I discussed the assessment and treatment plan with the patient. The patient was provided an opportunity to ask questions and all were answered. The patient agreed with the plan and demonstrated an understanding of the instructions. The patient was advised to call back or seek an in-person evaluation if the symptoms worsen or if the condition fails to improve as anticipated.   I provided 11 minutes of face-to-face Doximity time during this encounter.    Sabas Sous, MD 07/17/2019   I, Molly Dorshimer, am acting as scribe for Serena Croissant, MD.  I have reviewed the above documentation for accuracy and completeness, and I agree with the above.

## 2019-07-17 ENCOUNTER — Inpatient Hospital Stay (HOSPITAL_BASED_OUTPATIENT_CLINIC_OR_DEPARTMENT_OTHER): Payer: BC Managed Care – PPO | Admitting: Hematology and Oncology

## 2019-07-17 DIAGNOSIS — D500 Iron deficiency anemia secondary to blood loss (chronic): Secondary | ICD-10-CM | POA: Diagnosis not present

## 2019-07-17 DIAGNOSIS — I2699 Other pulmonary embolism without acute cor pulmonale: Secondary | ICD-10-CM

## 2019-07-17 NOTE — Assessment & Plan Note (Signed)
12/07/2018: ED visit for pain on inspiration for 2 years: Subpleural left lower lobe PE(suspected)although the differential diagnosis also included pulmonary infarct versus pneumonia versus neoplasm (low likelihood) Current treatment: Xarelto started February 2020  I discussed with the patient risk factors for blood clots.  Inherited risk factors: 1. Factor V Leiden mutation: Normal 2. Prothrombin gene G20210A: Normal 3. Protein S deficiency: Normal 4. Protein C deficiency: Normal 5. Antithrombin deficiency: Normal  Acquired risk factorsinclude: 1. 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

## 2019-07-17 NOTE — Assessment & Plan Note (Signed)
Left lower lobe lung mass: PET/CT does not show any activity and in fact it is smaller than before. It was 5.8 cm and it is now 4 cm with SUV of 2.9. CT chest 04/05/2019:Near complete resolution of the left lower pleural-based opacity consistent with an infarct We do not plan on repeating CT scans unless she has new symptoms. 

## 2019-07-17 NOTE — Assessment & Plan Note (Signed)
IV iron: 04/19/2019 for 2 doses Lab review: 07/15/2019: Ferritin improved from 15-124, iron saturation improved from 5% to 22%, hemoglobin improved from 9.1-12.6 Recommended recheck of labs and follow-up in 3 months

## 2019-07-29 ENCOUNTER — Other Ambulatory Visit: Payer: Self-pay | Admitting: Hematology and Oncology

## 2020-02-20 ENCOUNTER — Other Ambulatory Visit: Payer: Self-pay | Admitting: Hematology and Oncology

## 2020-04-30 ENCOUNTER — Telehealth: Payer: Self-pay | Admitting: Hematology and Oncology

## 2020-04-30 NOTE — Telephone Encounter (Signed)
Per 7/8 sch msg - scheduled apt . Pt is aware of appt date and time

## 2020-05-01 ENCOUNTER — Encounter: Payer: Self-pay | Admitting: *Deleted

## 2020-05-01 NOTE — Progress Notes (Signed)
Pt being referred back to Dr. Pamelia Hoit from PCP Dr. Porfirio Oar for evaluation and treatment of anemia.  RN received fax of recent office notes/ lab work and apt schedule for f/u.

## 2020-05-17 NOTE — Progress Notes (Signed)
Patient Care Team: Porfirio Oar, PA as PCP - General (Family Medicine)  DIAGNOSIS:    ICD-10-CM   1. Iron deficiency anemia due to chronic blood loss  D50.0   2. Acute pulmonary embolism without acute cor pulmonale, unspecified pulmonary embolism type (HCC)  I26.99   3. Pulmonary infarct (HCC)  I26.99     CHIEF COMPLIANT: Follow-up of PE and iron deficiency anemia  INTERVAL HISTORY: Lindsey Stewart is a 19 y.o. with above-mentioned history of iron deficiency anemia, for which she has received IV iron, and PE, for which she is on Xarelto. Labs on 04/24/20 showed: Hg 11.6, HCT 36.1, MCV 81.0, platelets 460, iron saturation 12%, ferritin 14. She presents to the clinic today for follow-up.    ALLERGIES:  has No Known Allergies.  MEDICATIONS:  Current Outpatient Medications  Medication Sig Dispense Refill  . ferrous sulfate 325 (65 FE) MG tablet Take 1 tablet (325 mg total) by mouth daily with breakfast. 60 tablet 0  . Multiple Vitamins-Iron (MULTIVITAMINS WITH IRON) TABS tablet Take 1 tablet by mouth daily. 60 tablet 0  . XARELTO 20 MG TABS tablet TAKE 1 TABLET(20 MG) BY MOUTH DAILY WITH SUPPER 30 tablet 2   No current facility-administered medications for this visit.    PHYSICAL EXAMINATION: ECOG PERFORMANCE STATUS: 1 - Symptomatic but completely ambulatory  Vitals:   05/18/20 1017  BP: 123/66  Pulse: 91  Resp: 18  Temp: 98.9 F (37.2 C)  SpO2: 99%   Filed Weights   05/18/20 1017  Weight: 131 lb 1.6 oz (59.5 kg)    LABORATORY DATA:  I have reviewed the data as listed CMP Latest Ref Rng & Units 04/05/2019 12/07/2018  Glucose 70 - 99 mg/dL 88 99  BUN 6 - 20 mg/dL 7 11  Creatinine 1.51 - 1.00 mg/dL 7.61 6.07  Sodium 371 - 145 mmol/L 139 138  Potassium 3.5 - 5.1 mmol/L 3.5 3.6  Chloride 98 - 111 mmol/L 105 105  CO2 22 - 32 mmol/L 24 26  Calcium 8.9 - 10.3 mg/dL 0.6(Y) 9.0  Total Protein 6.5 - 8.1 g/dL 7.5 6.9(S)  Total Bilirubin 0.3 - 1.2 mg/dL 0.3 8.5(I)    Alkaline Phos 38 - 126 U/L 95 129(H)  AST 15 - 41 U/L 9(L) 19  ALT 0 - 44 U/L 7 25    Lab Results  Component Value Date   WBC 10.4 07/15/2019   HGB 12.6 07/15/2019   HCT 39.4 07/15/2019   MCV 82.8 07/15/2019   PLT 388 07/15/2019   NEUTROABS 6.3 07/15/2019    ASSESSMENT & PLAN:  Pulmonary emboli (HCC) 12/07/2018: ED visit for pain on inspiration for 2 years: Subpleural left lower lobe PE(suspected)although the differential diagnosis also included pulmonary infarct versus pneumonia versus neoplasm (low likelihood) Current treatment: Xarelto started February 2020 Etiology: 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   Iron deficiency anemia due to chronic blood loss IV iron: 04/19/2019 for 2 doses Lab review:  07/15/2019: Ferritin improved from 15-124, iron saturation improved from 5% to 22%, hemoglobin improved from 9.1-12.6 Patient does not have any further problems with iron deficiency.   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 It was 5.8 cm and it is now 4 cm with SUV of 2.9. CT chest 04/05/2019:Near complete resolution of the left lower pleural-based opacity consistent with an infarct We do not  plan on repeating CT scans unless she has new symptoms.  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  She is also working 50 hours a week at [employer].  She is going to school at [employer]   No orders of the defined types were placed in this encounter.  The patient has a good understanding of the overall plan. she agrees with it. she will call with any problems that may develop before the next visit here.  Total time spent: 20 mins including face to face time and time spent for planning, charting and coordination of care  Serena Croissant, MD 05/18/2020  I, Kirt Boys Dorshimer, am  acting as scribe for Dr. Serena Croissant.  I have reviewed the above documentation for accuracy and completeness, and I agree with the above.

## 2020-05-18 ENCOUNTER — Other Ambulatory Visit: Payer: Self-pay

## 2020-05-18 ENCOUNTER — Telehealth: Payer: Self-pay | Admitting: Hematology and Oncology

## 2020-05-18 ENCOUNTER — Inpatient Hospital Stay: Payer: BC Managed Care – PPO | Attending: Hematology and Oncology | Admitting: Hematology and Oncology

## 2020-05-18 DIAGNOSIS — D6861 Antiphospholipid syndrome: Secondary | ICD-10-CM | POA: Insufficient documentation

## 2020-05-18 DIAGNOSIS — Z7901 Long term (current) use of anticoagulants: Secondary | ICD-10-CM | POA: Diagnosis not present

## 2020-05-18 DIAGNOSIS — D500 Iron deficiency anemia secondary to blood loss (chronic): Secondary | ICD-10-CM | POA: Diagnosis not present

## 2020-05-18 DIAGNOSIS — I2699 Other pulmonary embolism without acute cor pulmonale: Secondary | ICD-10-CM | POA: Insufficient documentation

## 2020-05-18 DIAGNOSIS — R918 Other nonspecific abnormal finding of lung field: Secondary | ICD-10-CM | POA: Diagnosis not present

## 2020-05-18 MED ORDER — RIVAROXABAN 20 MG PO TABS: 20.0000 mg | ORAL_TABLET | Freq: Every day | ORAL | 3 refills | Status: DC

## 2020-05-18 NOTE — Assessment & Plan Note (Signed)
Left lower lobe lung mass: PET/CT does not show any activity and in fact it is smaller than before. It was 5.8 cm and it is now 4 cm with SUV of 2.9. CT chest 04/05/2019:Near complete resolution of the left lower pleural-based opacity consistent with an infarct We do not plan on repeating CT scans unless she has new symptoms.

## 2020-05-18 NOTE — Telephone Encounter (Signed)
Scheduled appts per 7/26 los. Gave pt a print out of AVS.  

## 2020-05-18 NOTE — Assessment & Plan Note (Signed)
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

## 2020-05-18 NOTE — Assessment & Plan Note (Deleted)
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  

## 2020-05-18 NOTE — Assessment & Plan Note (Signed)
IV iron: 04/19/2019 for 2 doses Lab review:  07/15/2019: Ferritin improved from 15-124, iron saturation improved from 5% to 22%, hemoglobin improved from 9.1-12.6 05/18/2020:

## 2020-10-20 IMAGING — CT CT CHEST W/ CM
1 series · 14 of 34 positions shown, 18 images · IV contrast (APPLIED)
Comparison: None.

CLINICAL DATA: Pain on inspiration for 2 years. Idiopathic
pediatric arthritis. Abnormal chest x-ray at office.

EXAM:
CT CHEST WITH CONTRAST
TECHNIQUE: Multidetector CT imaging of the chest was performed during
intravenous contrast administration.
CONTRAST:  75mL [id number] IOPAMIDOL ([id number]) INJECTION 61%

[Series 2: chest w/cm · axial · 0.57mm/px · z∈[-255,-21]mm · 14 of 139 slices shown, 18 images]
[im 11/139  mediastinal]
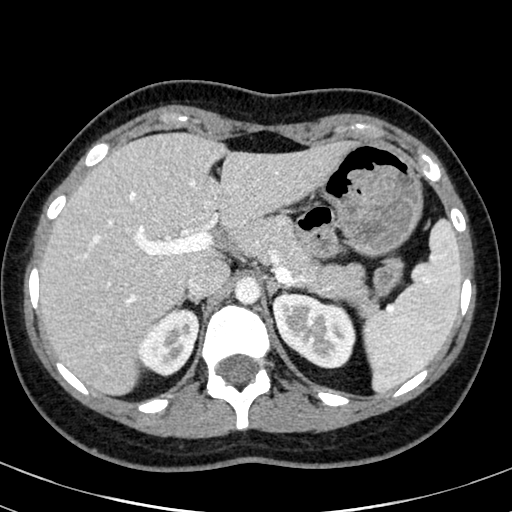
[im 11/139  lung]
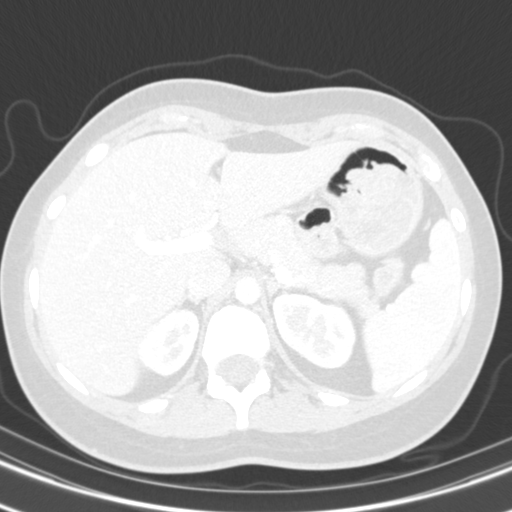
[im 21/139  lung]
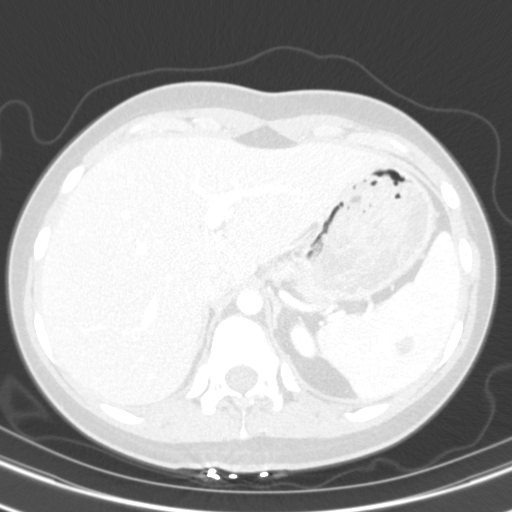
[im 28/139  lung]
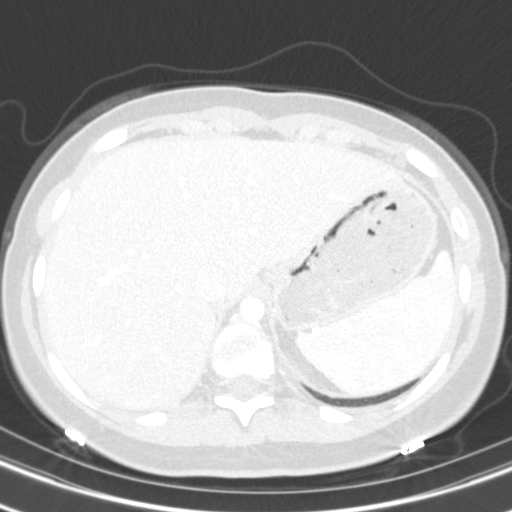
[im 41/139  lung]
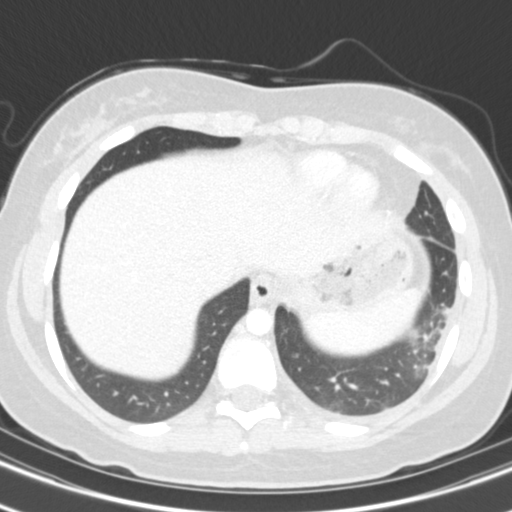
[im 52/139  mediastinal]
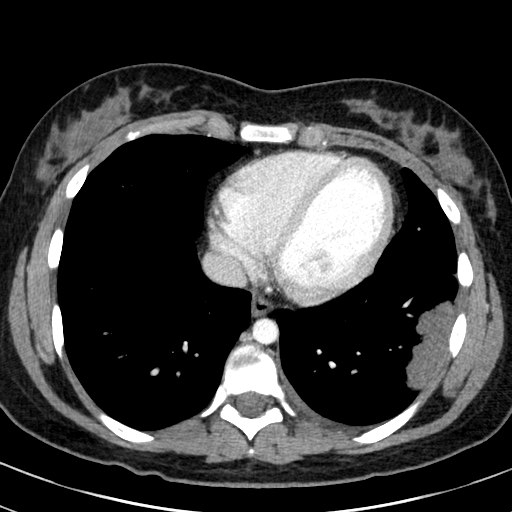
[im 52/139  lung]
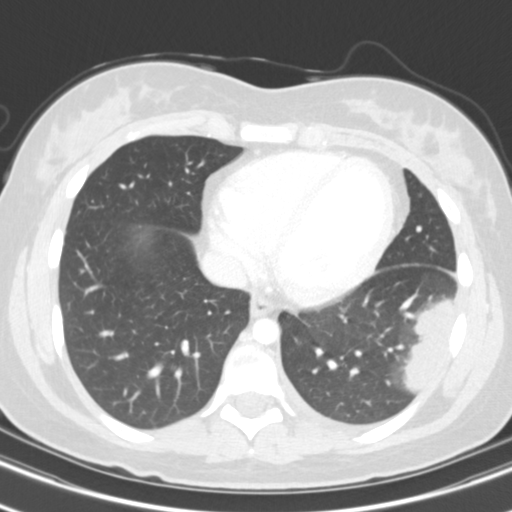
[im 57/139  lung]
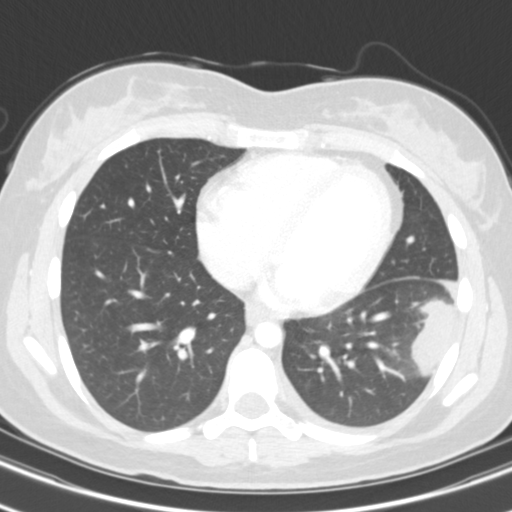
[im 65/139  lung]
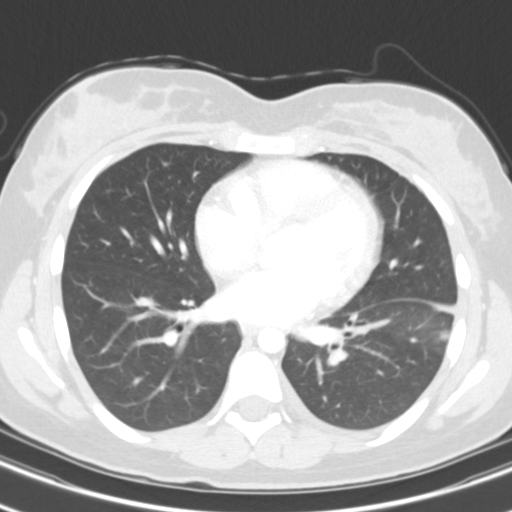
[im 75/139  lung]
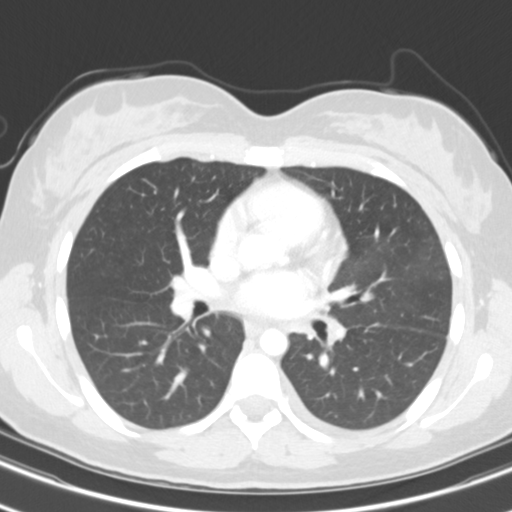
[im 82/139  mediastinal]
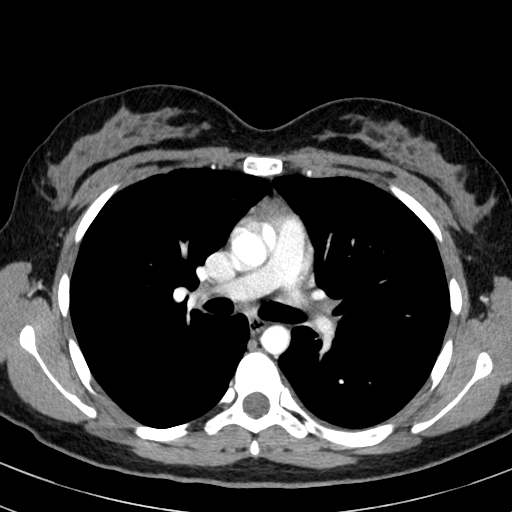
[im 82/139  lung]
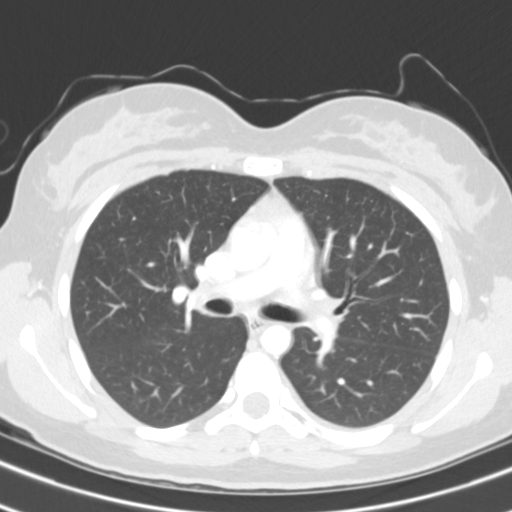
[im 87/139  lung]
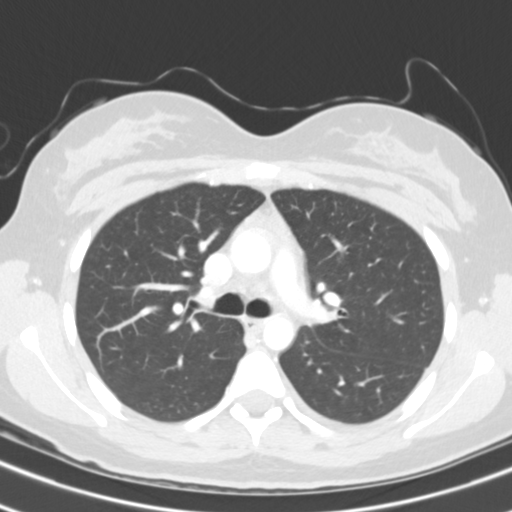
[im 103/139  lung]
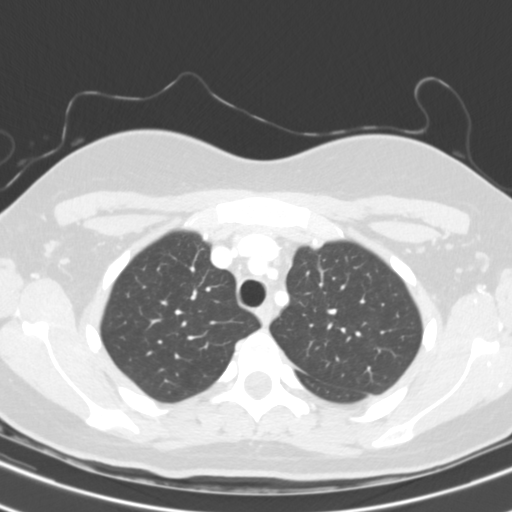
[im 111/139  lung]
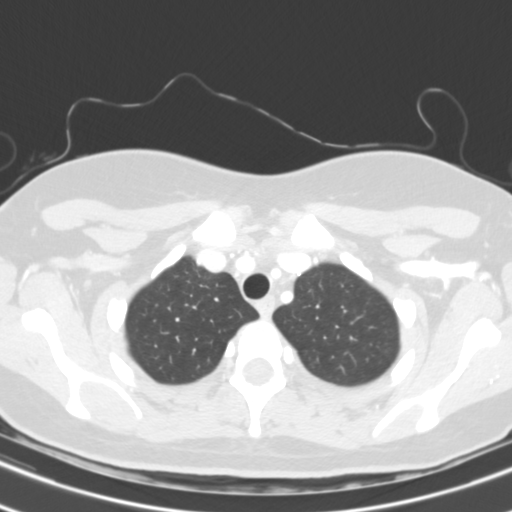
[im 118/139  mediastinal]
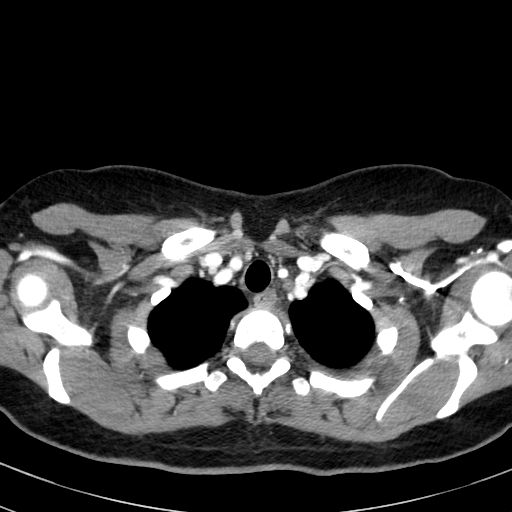
[im 118/139  lung]
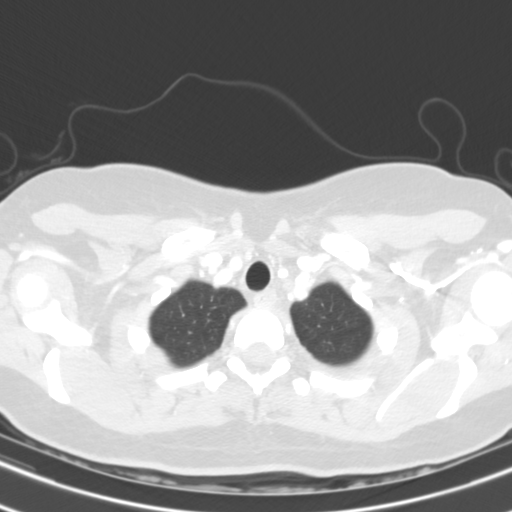
[im 128/139  lung]
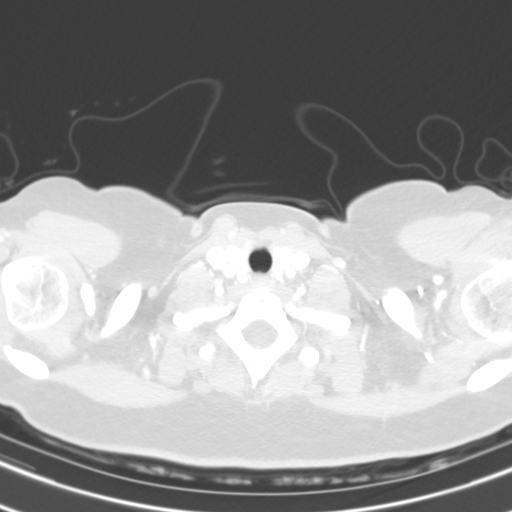

[14 of 34 positions shown; findings below may reference images not displayed]

FINDINGS: Cardiovascular: Study was performed as a routine CT, not as a CTA.
the aorta and great vessels appear normal. There is suboptimal
opacification of the pulmonary arteries. The left lower lobe
pulmonary artery appears attenuated with a possible peripheral
filling defect. No other evidence of pulmonary embolism. The heart
size is normal. There is no pericardial effusion.

Mediastinum/Nodes: There are no enlarged mediastinal, hilar or
axillary lymph nodes.There is residual thymic tissue in the anterior
mediastinum, typical for age. The thyroid gland, trachea and
esophagus demonstrate no significant findings.

Lungs/Pleura: There is a small left pleural effusion. There is no
right pleural effusion or pneumothorax. There is an ill-defined
subpleural mass in the left lower lobe, measuring up to 5.7 x 2.4 cm
on image 88. There is mild surrounding ground-glass density. This is
distal to the possible perfusion defect in the left lower lobe
pulmonary artery described above. No other pulmonary nodules or
confluent airspace opacities. There is no endobronchial lesion.

Upper abdomen: No suspicious findings in the visualized upper
abdomen. 11 mm low-density structure centrally in the spleen, likely
incidental. The adrenal glands appear normal.

Musculoskeletal/Chest wall: There is no chest wall mass or
suspicious osseous finding.
IMPRESSION: 1. Subpleural left lower lobe mass, favored to reflect a pulmonary
infarct and supported by poor opacification of feeding left lower
lobe pulmonary artery (which suggests subacute pulmonary embolism).
Other explanations for the pulmonary finding include pneumonia and
neoplasm (much less likely). Suggest lower extremity Doppler
ultrasound and follow-up chest CTA in 3-4 weeks following
anticoagulation. Hypercoagulability workup may be warranted.
2. Small left pleural effusion.
3. No adenopathy.
4. Critical Value/emergent results were called by telephone at the
time of interpretation on 12/07/2018 at [DATE] to Dr. GAB TIGER ,
who verbally acknowledged these results.

## 2021-02-16 IMAGING — CT CT CHEST WITH CONTRAST
2 of 4 series · 15 of 36 positions shown, 18 images · IV contrast (OMNIPAQUE)
Comparison: CT on 12/07/2018

CLINICAL DATA: Follow-up left lung mass.

EXAM:
CT CHEST WITH CONTRAST
TECHNIQUE: Multidetector CT imaging of the chest was performed during
intravenous contrast administration.
CONTRAST:  75mL OMNIPAQUE IOHEXOL 300 MG/ML  SOLN

[Series 2: axial st · axial · 0.71mm/px · z∈[-395,-161]mm · 12 of 137 slices shown, 15 images]
[im 10/137  mediastinal]
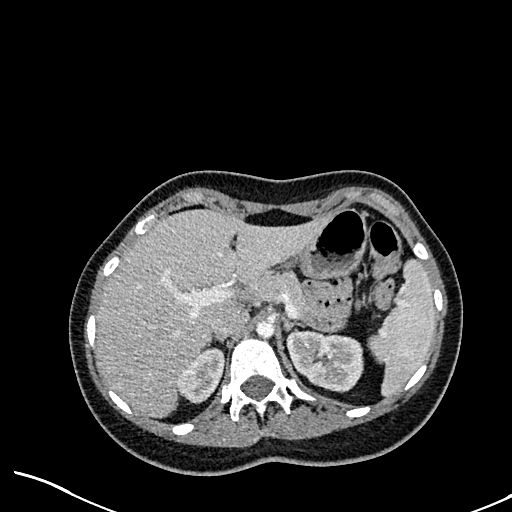
[im 10/137  lung]
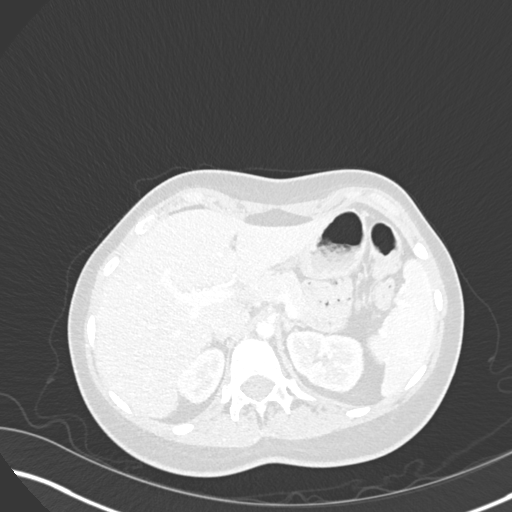
[im 20/137  lung]
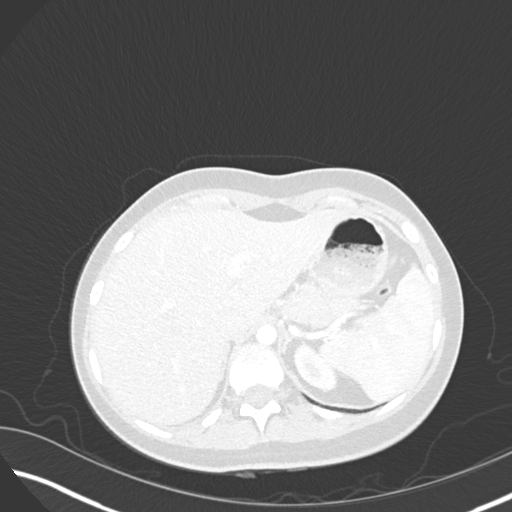
[im 30/137  lung]
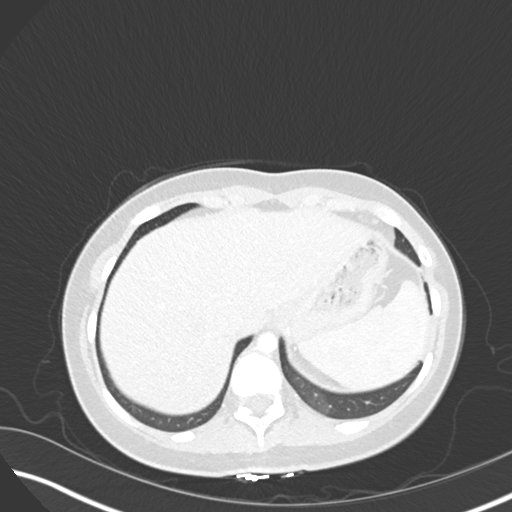
[im 39/137  lung]
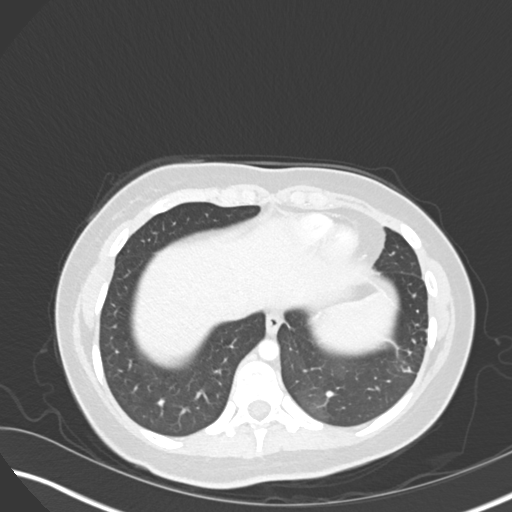
[im 49/137  mediastinal]
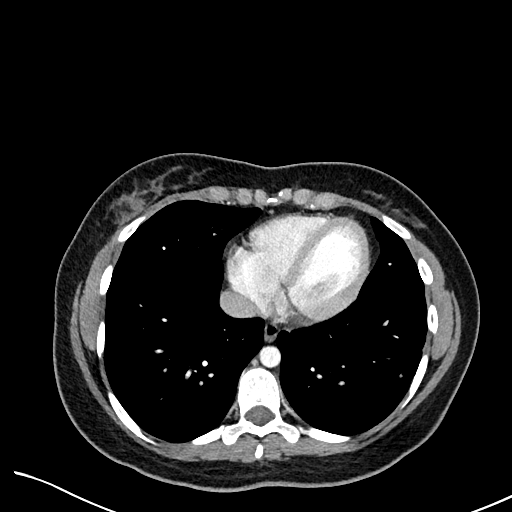
[im 49/137  lung]
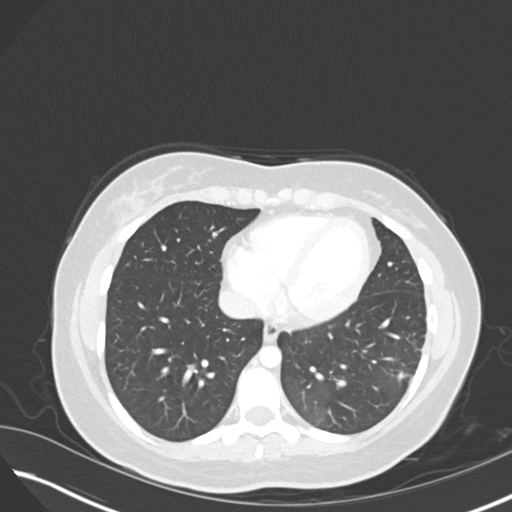
[im 59/137  lung]
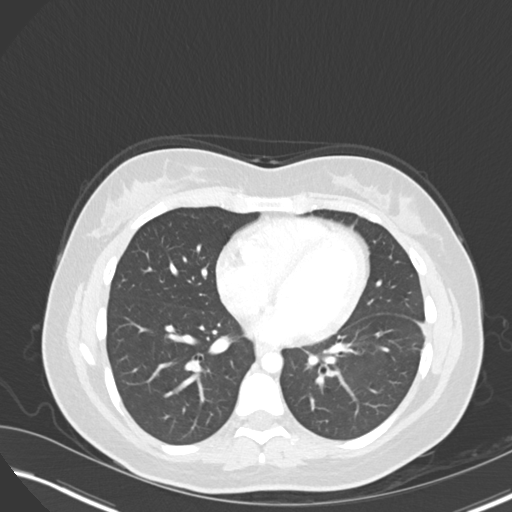
[im 78/137  lung]
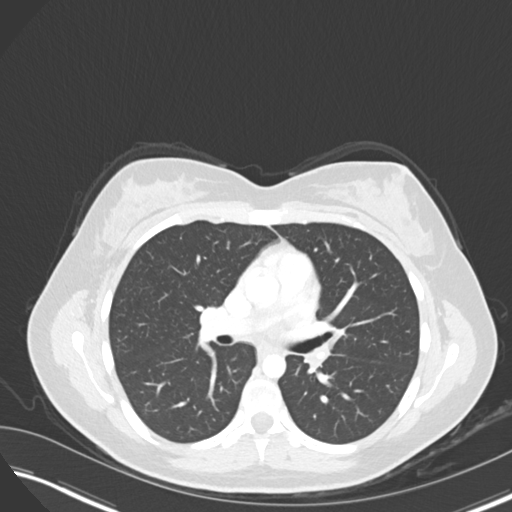
[im 88/137  lung]
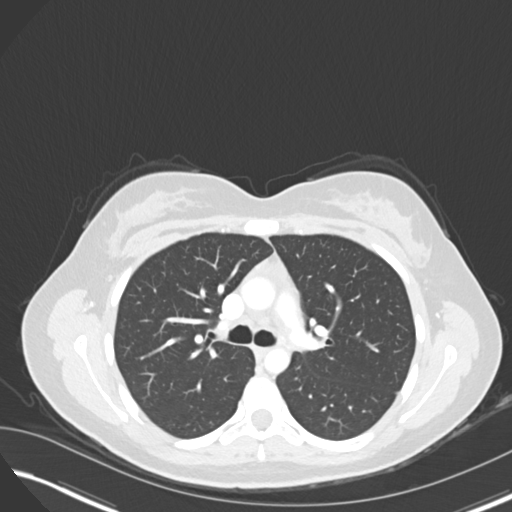
[im 98/137  mediastinal]
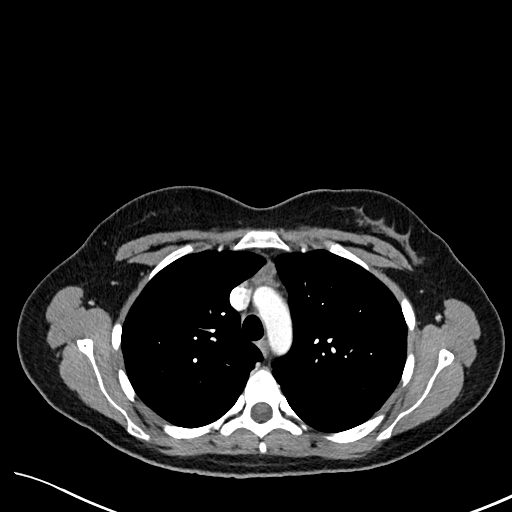
[im 98/137  lung]
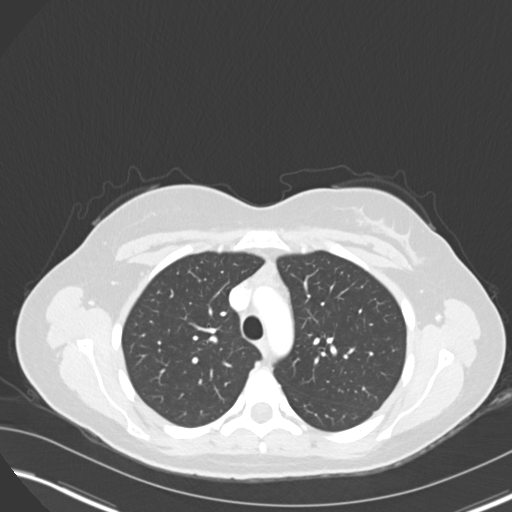
[im 107/137  lung]
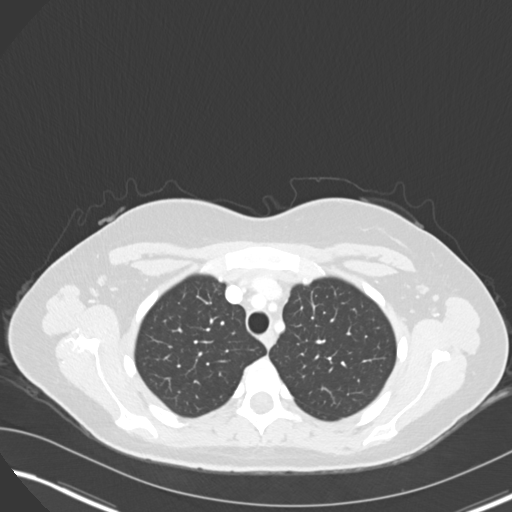
[im 117/137  lung]
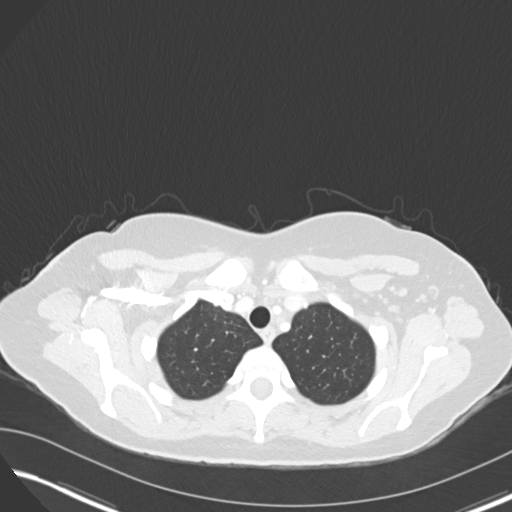
[im 127/137  lung]
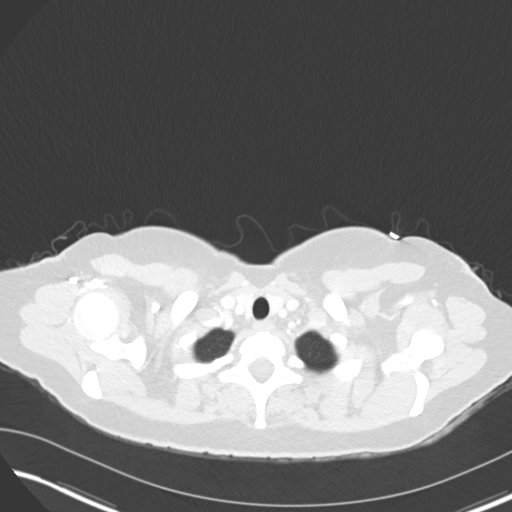

[Series 5: coronal · coronal · 0.61mm/px · 3 of 101 slices shown]
[im 21/101  lung]
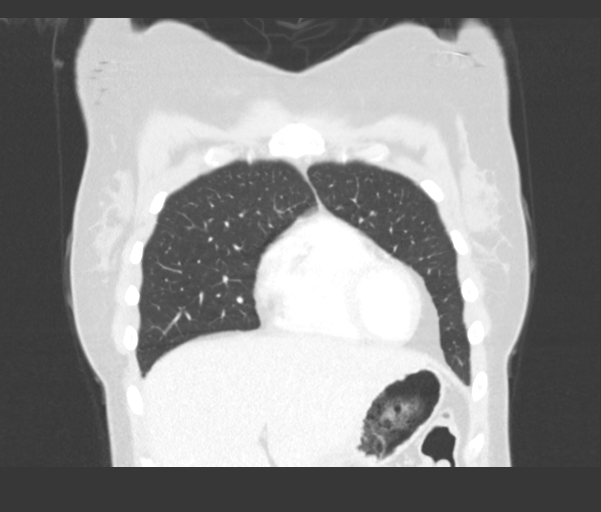
[im 41/101  lung]
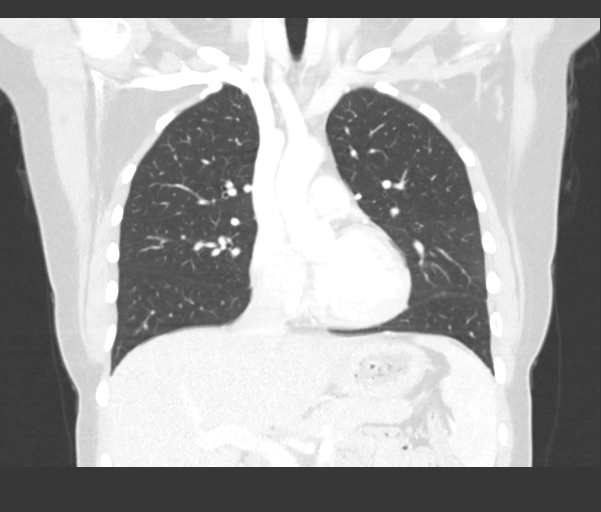
[im 61/101  lung]
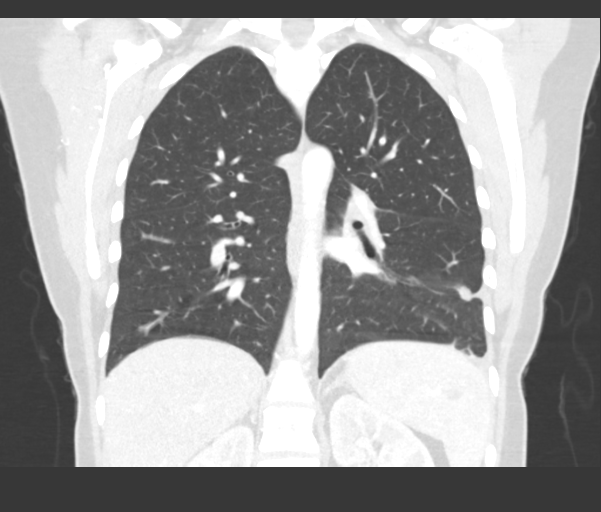

[15 of 36 positions shown; findings below may reference images not displayed]

FINDINGS: Cardiovascular:  No acute findings.

Mediastinum/Nodes: No masses or pathologically enlarged lymph nodes
identified. Residual thymic tissue in the anterior mediastinum is
stable and appropriate for age.

Lungs/Pleura: Pleural based opacity in the lateral left lower lobe
has nearly completely resolved since previous study, and may due to
to resolving pneumonia, infarct, or other inflammatory process. No
new or increased areas of parenchymal mass or consolidation. No
evidence of pleural effusion.

Upper Abdomen:  Unremarkable.

Musculoskeletal:  No suspicious bone lesions.
IMPRESSION: 1. Near complete resolution of left lower lobe pleural-based
opacity, consistent with resolving infarct or
inflammatory/infectious etiology.
2. No evidence of lymphadenopathy or pleural effusion.

## 2021-03-24 ENCOUNTER — Telehealth: Payer: Self-pay | Admitting: Hematology and Oncology

## 2021-03-24 NOTE — Telephone Encounter (Signed)
R/s per prov PAL, per 7/26 los, pt aware.

## 2021-05-10 ENCOUNTER — Other Ambulatory Visit: Payer: Self-pay

## 2021-05-10 DIAGNOSIS — D500 Iron deficiency anemia secondary to blood loss (chronic): Secondary | ICD-10-CM

## 2021-05-11 ENCOUNTER — Inpatient Hospital Stay: Payer: BC Managed Care – PPO | Attending: Hematology and Oncology

## 2021-05-11 ENCOUNTER — Other Ambulatory Visit: Payer: Self-pay

## 2021-05-11 DIAGNOSIS — Z7901 Long term (current) use of anticoagulants: Secondary | ICD-10-CM | POA: Diagnosis not present

## 2021-05-11 DIAGNOSIS — Z86711 Personal history of pulmonary embolism: Secondary | ICD-10-CM | POA: Insufficient documentation

## 2021-05-11 DIAGNOSIS — D509 Iron deficiency anemia, unspecified: Secondary | ICD-10-CM | POA: Insufficient documentation

## 2021-05-11 DIAGNOSIS — D500 Iron deficiency anemia secondary to blood loss (chronic): Secondary | ICD-10-CM

## 2021-05-11 DIAGNOSIS — R5383 Other fatigue: Secondary | ICD-10-CM | POA: Insufficient documentation

## 2021-05-11 DIAGNOSIS — R918 Other nonspecific abnormal finding of lung field: Secondary | ICD-10-CM | POA: Insufficient documentation

## 2021-05-11 DIAGNOSIS — D6861 Antiphospholipid syndrome: Secondary | ICD-10-CM | POA: Insufficient documentation

## 2021-05-11 LAB — CMP (CANCER CENTER ONLY)
ALT: 8 U/L (ref 0–44)
AST: 14 U/L — ABNORMAL LOW (ref 15–41)
Albumin: 4.1 g/dL (ref 3.5–5.0)
Alkaline Phosphatase: 107 U/L (ref 38–126)
Anion gap: 9 (ref 5–15)
BUN: 12 mg/dL (ref 6–20)
CO2: 26 mmol/L (ref 22–32)
Calcium: 9.1 mg/dL (ref 8.9–10.3)
Chloride: 105 mmol/L (ref 98–111)
Creatinine: 0.84 mg/dL (ref 0.44–1.00)
GFR, Estimated: >60 mL/min (ref >60)
Glucose, Bld: 115 mg/dL — ABNORMAL HIGH (ref 70–99)
Potassium: 4.1 mmol/L (ref 3.5–5.1)
Sodium: 140 mmol/L (ref 135–145)
Total Bilirubin: 0.5 mg/dL (ref 0.3–1.2)
Total Protein: 7.9 g/dL (ref 6.5–8.1)

## 2021-05-11 LAB — CBC WITH DIFFERENTIAL (CANCER CENTER ONLY)
Abs Immature Granulocytes: 0.07 10*3/uL (ref 0.00–0.07)
Basophils Absolute: 0.1 10*3/uL (ref 0.0–0.1)
Basophils Relative: 1 %
Eosinophils Absolute: 0.1 10*3/uL (ref 0.0–0.5)
Eosinophils Relative: 1 %
HCT: 36.1 % (ref 36.0–46.0)
Hemoglobin: 11.3 g/dL — ABNORMAL LOW (ref 12.0–15.0)
Immature Granulocytes: 1 %
Lymphocytes Relative: 22 %
Lymphs Abs: 1.8 10*3/uL (ref 0.7–4.0)
MCH: 24 pg — ABNORMAL LOW (ref 26.0–34.0)
MCHC: 31.3 g/dL (ref 30.0–36.0)
MCV: 76.6 fL — ABNORMAL LOW (ref 80.0–100.0)
Monocytes Absolute: 0.3 10*3/uL (ref 0.1–1.0)
Monocytes Relative: 4 %
Neutro Abs: 5.8 10*3/uL (ref 1.7–7.7)
Neutrophils Relative %: 71 %
Platelet Count: 450 10*3/uL — ABNORMAL HIGH (ref 150–400)
RBC: 4.71 MIL/uL (ref 3.87–5.11)
RDW: 15 % (ref 11.5–15.5)
WBC Count: 8.1 10*3/uL (ref 4.0–10.5)
nRBC: 0 % (ref 0.0–0.2)

## 2021-05-11 LAB — IRON AND TIBC
Iron: 29 ug/dL — ABNORMAL LOW (ref 41–142)
Saturation Ratios: 7 % — ABNORMAL LOW (ref 21–57)
TIBC: 405 ug/dL (ref 236–444)
UIBC: 376 ug/dL (ref 120–384)

## 2021-05-11 LAB — FERRITIN: Ferritin: 6 ng/mL — ABNORMAL LOW (ref 11–307)

## 2021-05-12 NOTE — Progress Notes (Signed)
HEMATOLOGY-ONCOLOGY MYCHART VIDEO VISIT PROGRESS NOTE  I connected with Lindsey Stewart on 05/13/2021 at  3:15 PM EDT by MyChart video conference and verified that I am speaking with the correct person using two identifiers.  I discussed the limitations, risks, security and privacy concerns of performing an evaluation and management service by MyChart and the availability of in person appointments.  I also discussed with the patient that there may be a patient responsible charge related to this service. The patient expressed understanding and agreed to proceed.  Patient's Location: Home Physician Location: Clinic  CHIEF COMPLIANT:  Follow-up of PE and iron deficiency anemia  INTERVAL HISTORY: Lindsey Stewart is a 20 y.o. female with above-mentioned history of PE and IDA, for which she has received IV iron, and PE, for which she is on Xarelto. Labs on 05/11/21 showed: Hg 11.3, HCT 36.1, MCV 76.6, platelets 450, iron saturation 7%, ferritin 6. She presents via MyChart today for follow-up and to review her recent labs. She complains of fatigue symptoms and decreased appetite.  She has not noticed any blood in the stool.  Her menstrual cycles are not heavy.  It is unclear why she is iron deficient to begin with.  She tells me that she has gluten sensitivity but celiac antibodies were previously negative.  We suspected malabsorption of iron as a potential cause.  However she has never had endoscopy or colonoscopy.  Observations/Objective:  There were no vitals filed for this visit. There is no height or weight on file to calculate BMI.  I have reviewed the data as listed CMP Latest Ref Rng & Units 05/11/2021 04/05/2019 12/07/2018  Glucose 70 - 99 mg/dL 300(P) 88 99  BUN 6 - 20 mg/dL 12 7 11   Creatinine 0.44 - 1.00 mg/dL 2.33 0.07  Sodium 135 - 145 mmol/L 140 139 138  Potassium 3.5 - 5.1 mmol/L 4.1 3.5 3.6  Chloride 98 - 111 mmol/L 105 105 105  CO2 22 - 32 mmol/L 26 24 26   Calcium 8.9 -  10.3 mg/dL 9.1 6.22) 9.0  Total Protein 6.5 - 8.1 g/dL 7.9 7.5 )  Total Bilirubin 0.3 - 1.2 mg/dL 0.5 0.3 6.3(F)  Alkaline Phos 38 - 126 U/L 107 95 129(H)  AST 15 - 41 U/L 14(L) 9(L) 19  ALT 0 - 44 U/L 8 7 25     Lab Results  Component Value Date   WBC 8.1 05/11/2021   HGB 11.3 (L) 05/11/2021   HCT 36.1 05/11/2021   MCV 76.6 (L) 05/11/2021   PLT 450 (H) 05/11/2021   NEUTROABS 5.8 05/11/2021      Assessment Plan:  Iron deficiency anemia due to chronic blood loss IV iron: 04/19/2019 for 2 doses Lab review: 07/15/2019: Ferritin improved from 15-124, iron saturation improved from 5% to 22%, hemoglobin improved from 9.1-12.6 05/11/2021: Hemoglobin 11.3, MCV 76.6, iron saturation 7%, ferritin 6 Recommendation: IV iron therapy Patient has moved to [facility] for studies.  She will not be in [place] anymore for the next 2 years at least. Therefore I recommended that she transfer her care to [place]. I will make a referral to Dr[facility] at [facility] I recommended that she get a gastroenterology evaluation because she has never had a GI work-up.   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  Left lower lobe lung mass: PET/CT does not show any activity and in fact it is smaller than before.  It was 5.8 cm and it is now 4 cm with SUV of 2.9. CT chest 04/05/2019: Near complete resolution of the left lower pleural-based opacity consistent with an infarct    I discussed the assessment and treatment plan with the patient. The patient was provided an opportunity to ask  questions and all were answered. The patient agreed with the plan and demonstrated an understanding of the instructions. The patient was advised to call back or seek an in-person evaluation if the symptoms worsen or if the condition fails to improve as anticipated.   Total time spent: 20 minutes including face-to-face MyChart video visit time and time spent for planning, charting and coordination of care  Sabas Sous, MD 05/13/2021  I, Alda Ponder am acting as scribe for Serena Croissant, MD.  I have reviewed the above documentation for accuracy and completeness, and I agree with the above.

## 2021-05-13 ENCOUNTER — Inpatient Hospital Stay (HOSPITAL_BASED_OUTPATIENT_CLINIC_OR_DEPARTMENT_OTHER): Payer: BC Managed Care – PPO | Admitting: Hematology and Oncology

## 2021-05-13 ENCOUNTER — Other Ambulatory Visit: Payer: Self-pay

## 2021-05-13 DIAGNOSIS — D509 Iron deficiency anemia, unspecified: Secondary | ICD-10-CM | POA: Diagnosis not present

## 2021-05-13 DIAGNOSIS — D500 Iron deficiency anemia secondary to blood loss (chronic): Secondary | ICD-10-CM | POA: Diagnosis not present

## 2021-05-13 DIAGNOSIS — I2699 Other pulmonary embolism without acute cor pulmonale: Secondary | ICD-10-CM | POA: Diagnosis not present

## 2021-05-13 NOTE — Progress Notes (Signed)
Referral successfully faxed referral to Dr. Nicki [place] office per MD recommendations. [phone]

## 2021-05-13 NOTE — Assessment & Plan Note (Signed)
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  Left lower lobe lung mass: PET/CT does not show any activity and in fact it is smaller than before. It was 5.8 cm and it is now 4 cm with SUV of 2.9. CT chest 04/05/2019:Near complete resolution of the left lower pleural-based opacity consistent with an infarct

## 2021-05-13 NOTE — Assessment & Plan Note (Signed)
IV iron: 04/19/2019 for 2 doses Lab review: 07/15/2019: Ferritin improved from 15-124, iron saturation improved from 5% to 22%, hemoglobin improved from 9.1-12.6 05/11/2021: Hemoglobin 11.3, MCV 76.6, iron saturation 7%, ferritin 6 Recommendation: IV iron therapy  Recheck labs in 6 months and follow-up after that

## 2021-05-18 ENCOUNTER — Other Ambulatory Visit: Payer: BC Managed Care – PPO

## 2021-05-20 ENCOUNTER — Telehealth: Payer: BC Managed Care – PPO | Admitting: Hematology and Oncology

## 2021-06-10 ENCOUNTER — Other Ambulatory Visit: Payer: Self-pay | Admitting: Hematology and Oncology
